# Patient Record
Sex: Female | Born: 1945 | Race: White | Hispanic: No | Smoking: Former smoker
Health system: Southern US, Community
[De-identification: ages and names within clinical notes are randomized; demographics above are authoritative.]

## PROBLEM LIST (undated history)

## (undated) DIAGNOSIS — R05 Cough: Secondary | ICD-10-CM

## (undated) DIAGNOSIS — Z9109 Other allergy status, other than to drugs and biological substances: Secondary | ICD-10-CM

## (undated) DIAGNOSIS — M7120 Synovial cyst of popliteal space [Baker], unspecified knee: Secondary | ICD-10-CM

## (undated) DIAGNOSIS — R053 Chronic cough: Secondary | ICD-10-CM

## (undated) DIAGNOSIS — M810 Age-related osteoporosis without current pathological fracture: Secondary | ICD-10-CM

## (undated) DIAGNOSIS — E7800 Pure hypercholesterolemia, unspecified: Secondary | ICD-10-CM

## (undated) DIAGNOSIS — M1990 Unspecified osteoarthritis, unspecified site: Secondary | ICD-10-CM

## (undated) DIAGNOSIS — K219 Gastro-esophageal reflux disease without esophagitis: Secondary | ICD-10-CM

## (undated) HISTORY — PX: APPENDECTOMY: SHX54

## (undated) HISTORY — DX: Chronic cough: R05.3

## (undated) HISTORY — PX: OOPHORECTOMY: SHX86

## (undated) HISTORY — DX: Gastro-esophageal reflux disease without esophagitis: K21.9

## (undated) HISTORY — DX: Cough: R05

## (undated) HISTORY — DX: Age-related osteoporosis without current pathological fracture: M81.0

## (undated) HISTORY — DX: Synovial cyst of popliteal space (Baker), unspecified knee: M71.20

## (undated) HISTORY — DX: Other allergy status, other than to drugs and biological substances: Z91.09

## (undated) HISTORY — DX: Unspecified osteoarthritis, unspecified site: M19.90

---

## 1997-12-20 ENCOUNTER — Ambulatory Visit (HOSPITAL_COMMUNITY): Admission: RE | Admit: 1997-12-20 | Discharge: 1997-12-20 | Payer: Self-pay | Admitting: Family Medicine

## 1997-12-28 ENCOUNTER — Other Ambulatory Visit: Admission: RE | Admit: 1997-12-28 | Discharge: 1997-12-28 | Payer: Self-pay | Admitting: Family Medicine

## 1999-01-15 ENCOUNTER — Other Ambulatory Visit: Admission: RE | Admit: 1999-01-15 | Discharge: 1999-01-15 | Payer: Self-pay | Admitting: Family Medicine

## 1999-03-21 ENCOUNTER — Encounter: Admission: RE | Admit: 1999-03-21 | Discharge: 1999-03-21 | Payer: Self-pay | Admitting: Family Medicine

## 2000-01-14 ENCOUNTER — Other Ambulatory Visit: Admission: RE | Admit: 2000-01-14 | Discharge: 2000-01-14 | Payer: Self-pay | Admitting: Family Medicine

## 2000-03-25 ENCOUNTER — Encounter: Admission: RE | Admit: 2000-03-25 | Discharge: 2000-03-25 | Payer: Self-pay | Admitting: Family Medicine

## 2000-03-25 ENCOUNTER — Encounter: Payer: Self-pay | Admitting: Family Medicine

## 2000-05-05 ENCOUNTER — Encounter: Admission: RE | Admit: 2000-05-05 | Discharge: 2000-05-05 | Payer: Self-pay | Admitting: Family Medicine

## 2000-05-05 ENCOUNTER — Encounter: Payer: Self-pay | Admitting: Family Medicine

## 2001-01-14 ENCOUNTER — Other Ambulatory Visit: Admission: RE | Admit: 2001-01-14 | Discharge: 2001-01-14 | Payer: Self-pay | Admitting: Family Medicine

## 2001-03-26 ENCOUNTER — Encounter: Payer: Self-pay | Admitting: Family Medicine

## 2001-03-26 ENCOUNTER — Encounter: Admission: RE | Admit: 2001-03-26 | Discharge: 2001-03-26 | Payer: Self-pay | Admitting: Family Medicine

## 2002-01-15 ENCOUNTER — Other Ambulatory Visit: Admission: RE | Admit: 2002-01-15 | Discharge: 2002-01-15 | Payer: Self-pay | Admitting: Family Medicine

## 2002-03-30 ENCOUNTER — Encounter: Payer: Self-pay | Admitting: Family Medicine

## 2002-03-30 ENCOUNTER — Encounter: Admission: RE | Admit: 2002-03-30 | Discharge: 2002-03-30 | Payer: Self-pay | Admitting: Family Medicine

## 2003-01-19 ENCOUNTER — Other Ambulatory Visit: Admission: RE | Admit: 2003-01-19 | Discharge: 2003-01-19 | Payer: Self-pay | Admitting: Family Medicine

## 2003-04-07 ENCOUNTER — Encounter: Admission: RE | Admit: 2003-04-07 | Discharge: 2003-04-07 | Payer: Self-pay | Admitting: Family Medicine

## 2004-01-31 ENCOUNTER — Other Ambulatory Visit: Admission: RE | Admit: 2004-01-31 | Discharge: 2004-01-31 | Payer: Self-pay | Admitting: Family Medicine

## 2004-04-16 ENCOUNTER — Encounter: Admission: RE | Admit: 2004-04-16 | Discharge: 2004-04-16 | Payer: Self-pay | Admitting: Family Medicine

## 2005-02-01 ENCOUNTER — Other Ambulatory Visit: Admission: RE | Admit: 2005-02-01 | Discharge: 2005-02-01 | Payer: Self-pay | Admitting: Family Medicine

## 2005-02-15 ENCOUNTER — Ambulatory Visit: Payer: Self-pay | Admitting: Internal Medicine

## 2005-02-27 ENCOUNTER — Ambulatory Visit: Payer: Self-pay | Admitting: Internal Medicine

## 2005-04-18 ENCOUNTER — Encounter: Admission: RE | Admit: 2005-04-18 | Discharge: 2005-04-18 | Payer: Self-pay | Admitting: Family Medicine

## 2006-04-04 ENCOUNTER — Other Ambulatory Visit: Admission: RE | Admit: 2006-04-04 | Discharge: 2006-04-04 | Payer: Self-pay | Admitting: Family Medicine

## 2006-04-23 ENCOUNTER — Encounter: Admission: RE | Admit: 2006-04-23 | Discharge: 2006-04-23 | Payer: Self-pay | Admitting: Family Medicine

## 2006-11-12 ENCOUNTER — Encounter: Admission: RE | Admit: 2006-11-12 | Discharge: 2006-11-12 | Payer: Self-pay | Admitting: Family Medicine

## 2006-11-15 ENCOUNTER — Encounter: Admission: RE | Admit: 2006-11-15 | Discharge: 2006-11-15 | Payer: Self-pay | Admitting: Unknown Physician Specialty

## 2007-04-08 ENCOUNTER — Other Ambulatory Visit: Admission: RE | Admit: 2007-04-08 | Discharge: 2007-04-08 | Payer: Self-pay | Admitting: Family Medicine

## 2007-04-27 ENCOUNTER — Encounter: Admission: RE | Admit: 2007-04-27 | Discharge: 2007-04-27 | Payer: Self-pay | Admitting: Family Medicine

## 2007-09-29 ENCOUNTER — Encounter: Admission: RE | Admit: 2007-09-29 | Discharge: 2007-09-29 | Payer: Self-pay | Admitting: Family Medicine

## 2007-10-05 ENCOUNTER — Encounter: Admission: RE | Admit: 2007-10-05 | Discharge: 2007-10-05 | Payer: Self-pay | Admitting: Family Medicine

## 2007-11-17 ENCOUNTER — Encounter: Admission: RE | Admit: 2007-11-17 | Discharge: 2007-11-17 | Payer: Self-pay | Admitting: Family Medicine

## 2008-02-16 ENCOUNTER — Encounter: Admission: RE | Admit: 2008-02-16 | Discharge: 2008-02-16 | Payer: Self-pay | Admitting: Dermatology

## 2008-04-13 ENCOUNTER — Other Ambulatory Visit: Admission: RE | Admit: 2008-04-13 | Discharge: 2008-04-13 | Payer: Self-pay | Admitting: Family Medicine

## 2008-04-28 ENCOUNTER — Encounter: Admission: RE | Admit: 2008-04-28 | Discharge: 2008-04-28 | Payer: Self-pay | Admitting: Family Medicine

## 2008-09-24 ENCOUNTER — Emergency Department (HOSPITAL_COMMUNITY): Admission: EM | Admit: 2008-09-24 | Discharge: 2008-09-24 | Payer: Self-pay | Admitting: Emergency Medicine

## 2009-05-01 ENCOUNTER — Encounter: Admission: RE | Admit: 2009-05-01 | Discharge: 2009-05-01 | Payer: Self-pay | Admitting: Family Medicine

## 2010-05-07 ENCOUNTER — Encounter: Admission: RE | Admit: 2010-05-07 | Discharge: 2010-05-07 | Payer: Self-pay | Admitting: Family Medicine

## 2011-04-08 ENCOUNTER — Other Ambulatory Visit: Payer: Self-pay | Admitting: Family Medicine

## 2011-04-08 DIAGNOSIS — Z1231 Encounter for screening mammogram for malignant neoplasm of breast: Secondary | ICD-10-CM

## 2011-04-25 ENCOUNTER — Other Ambulatory Visit: Payer: Self-pay | Admitting: Family Medicine

## 2011-04-25 DIAGNOSIS — Z780 Asymptomatic menopausal state: Secondary | ICD-10-CM

## 2011-05-09 ENCOUNTER — Ambulatory Visit
Admission: RE | Admit: 2011-05-09 | Discharge: 2011-05-09 | Disposition: A | Discharge status: Home / Self Care | Payer: Medicare Other | Source: Ambulatory Visit | Attending: Family Medicine | Admitting: Family Medicine

## 2011-05-09 DIAGNOSIS — Z1231 Encounter for screening mammogram for malignant neoplasm of breast: Secondary | ICD-10-CM

## 2011-05-09 DIAGNOSIS — Z780 Asymptomatic menopausal state: Secondary | ICD-10-CM

## 2012-04-02 ENCOUNTER — Other Ambulatory Visit: Payer: Self-pay | Admitting: Family Medicine

## 2012-04-02 DIAGNOSIS — Z1231 Encounter for screening mammogram for malignant neoplasm of breast: Secondary | ICD-10-CM

## 2012-05-11 ENCOUNTER — Ambulatory Visit
Admission: RE | Admit: 2012-05-11 | Discharge: 2012-05-11 | Disposition: A | Discharge status: Home / Self Care | Payer: Medicare Other | Source: Ambulatory Visit | Attending: Family Medicine | Admitting: Family Medicine

## 2012-05-11 DIAGNOSIS — Z1231 Encounter for screening mammogram for malignant neoplasm of breast: Secondary | ICD-10-CM

## 2013-04-07 ENCOUNTER — Other Ambulatory Visit: Payer: Self-pay

## 2013-04-07 DIAGNOSIS — Z1231 Encounter for screening mammogram for malignant neoplasm of breast: Secondary | ICD-10-CM

## 2013-04-26 ENCOUNTER — Other Ambulatory Visit: Payer: Self-pay | Admitting: Family Medicine

## 2013-04-26 DIAGNOSIS — M810 Age-related osteoporosis without current pathological fracture: Secondary | ICD-10-CM

## 2013-05-12 ENCOUNTER — Ambulatory Visit: Payer: Medicare Other

## 2013-05-28 ENCOUNTER — Ambulatory Visit
Admission: RE | Admit: 2013-05-28 | Discharge: 2013-05-28 | Disposition: A | Discharge status: Home / Self Care | Payer: Medicare Other | Source: Ambulatory Visit

## 2013-05-28 ENCOUNTER — Ambulatory Visit
Admission: RE | Admit: 2013-05-28 | Discharge: 2013-05-28 | Disposition: A | Discharge status: Home / Self Care | Payer: Medicare Other | Source: Ambulatory Visit | Attending: Family Medicine | Admitting: Family Medicine

## 2013-05-28 DIAGNOSIS — M810 Age-related osteoporosis without current pathological fracture: Secondary | ICD-10-CM

## 2013-05-28 DIAGNOSIS — Z1231 Encounter for screening mammogram for malignant neoplasm of breast: Secondary | ICD-10-CM

## 2014-05-02 ENCOUNTER — Other Ambulatory Visit: Payer: Self-pay

## 2014-05-02 DIAGNOSIS — Z1231 Encounter for screening mammogram for malignant neoplasm of breast: Secondary | ICD-10-CM

## 2014-05-30 ENCOUNTER — Ambulatory Visit
Admission: RE | Admit: 2014-05-30 | Discharge: 2014-05-30 | Disposition: A | Discharge status: Home / Self Care | Payer: 59 | Source: Ambulatory Visit

## 2014-05-30 DIAGNOSIS — Z1231 Encounter for screening mammogram for malignant neoplasm of breast: Secondary | ICD-10-CM

## 2014-12-08 ENCOUNTER — Encounter: Payer: Self-pay | Admitting: Internal Medicine

## 2015-02-19 ENCOUNTER — Emergency Department (HOSPITAL_COMMUNITY): Payer: Medicare Other

## 2015-02-19 ENCOUNTER — Emergency Department (HOSPITAL_COMMUNITY)
Admission: EM | Admit: 2015-02-19 | Discharge: 2015-02-20 | Disposition: A | Discharge status: Home / Self Care | Payer: Medicare Other | Attending: Emergency Medicine | Admitting: Emergency Medicine

## 2015-02-19 ENCOUNTER — Encounter (HOSPITAL_COMMUNITY): Payer: Self-pay | Admitting: Emergency Medicine

## 2015-02-19 DIAGNOSIS — S42102A Fracture of unspecified part of scapula, left shoulder, initial encounter for closed fracture: Secondary | ICD-10-CM

## 2015-02-19 DIAGNOSIS — S42112A Displaced fracture of body of scapula, left shoulder, initial encounter for closed fracture: Secondary | ICD-10-CM | POA: Diagnosis not present

## 2015-02-19 DIAGNOSIS — Z8639 Personal history of other endocrine, nutritional and metabolic disease: Secondary | ICD-10-CM | POA: Diagnosis not present

## 2015-02-19 DIAGNOSIS — S52515A Nondisplaced fracture of left radial styloid process, initial encounter for closed fracture: Secondary | ICD-10-CM | POA: Diagnosis not present

## 2015-02-19 DIAGNOSIS — S4992XA Unspecified injury of left shoulder and upper arm, initial encounter: Secondary | ICD-10-CM | POA: Diagnosis present

## 2015-02-19 DIAGNOSIS — W19XXXA Unspecified fall, initial encounter: Secondary | ICD-10-CM

## 2015-02-19 DIAGNOSIS — Z87891 Personal history of nicotine dependence: Secondary | ICD-10-CM | POA: Diagnosis not present

## 2015-02-19 DIAGNOSIS — Y9301 Activity, walking, marching and hiking: Secondary | ICD-10-CM | POA: Diagnosis not present

## 2015-02-19 DIAGNOSIS — Y998 Other external cause status: Secondary | ICD-10-CM | POA: Diagnosis not present

## 2015-02-19 DIAGNOSIS — W541XXA Struck by dog, initial encounter: Secondary | ICD-10-CM | POA: Diagnosis not present

## 2015-02-19 DIAGNOSIS — S52502A Unspecified fracture of the lower end of left radius, initial encounter for closed fracture: Secondary | ICD-10-CM

## 2015-02-19 DIAGNOSIS — Y9289 Other specified places as the place of occurrence of the external cause: Secondary | ICD-10-CM | POA: Diagnosis not present

## 2015-02-19 HISTORY — DX: Pure hypercholesterolemia, unspecified: E78.00

## 2015-02-19 MED ORDER — ONDANSETRON 4 MG PO TBDP
4.0000 mg | ORAL_TABLET | Freq: Once | ORAL | Status: AC
  Administered 2015-02-19: 4 mg via ORAL
  Filled 2015-02-19: qty 1

## 2015-02-19 MED ORDER — OXYCODONE HCL 5 MG PO TABS
2.5000 mg | ORAL_TABLET | Freq: Once | ORAL | Status: AC
  Administered 2015-02-19: 2.5 mg via ORAL
  Filled 2015-02-19: qty 1

## 2015-02-19 NOTE — ED Provider Notes (Signed)
CSN: [id number]     Arrival date & time 02/19/15  2035 History   First MD Initiated Contact with Patient 02/19/15 2213     Chief Complaint  Patient presents with  . Fall     Patient is a 69 y.o. female presenting with fall. The history is provided by the patient. No language interpreter was used.  Fall   Ms. Lesko presents for evaluation of injuries following a fall. She was walking her border collie about 7:30 this evening when the dog saw another dog and holds her left arm forward in the leash. She was pulled forward and struck her face on the fence and rolled slightly down a hill. She experienced pain in her left wrist and shoulder. No loss of consciousness but she did knock out part of a tooth. She did have some nausea that is now resolved. No numbness, weakness. She denies any medical problems. She is left-handed.   Past Medical History  Diagnosis Date  . Hypercholesterolemia    Past Surgical History  Procedure Laterality Date  . Appendectomy    . Oophorectomy     No family history on file. Social History  Substance Use Topics  . Smoking status: Former Games developer  . Smokeless tobacco: None  . Alcohol Use: No   OB History    No data available     Review of Systems  All other systems reviewed and are negative.     Allergies  Codeine  Home Medications   Prior to Admission medications   Not on File   BP 107/57 mmHg  Pulse 62  Temp(Src) 97.9 F (36.6 C) (Oral)  Resp 18  Ht  (1.676 m)  Wt 144 lb (65.318 kg)  BMI 23.25 kg/m2  SpO2 97% Physical Exam  Constitutional: She is oriented to person, place, and time. She appears well-developed and well-nourished.  HENT:  Head: Normocephalic and atraumatic.  Cardiovascular: Normal rate and regular rhythm.   No murmur heard. Pulmonary/Chest: Effort normal and breath sounds normal. No respiratory distress.  Abdominal: Soft. There is no tenderness. There is no rebound and no guarding.  Musculoskeletal:  Tenderness  to palpation over the left posterior shoulder with decreased range of motion. Minimal tenderness and swelling to the left wrist with preserved range of motion. 2+ radial pulses bilaterally. No pelvic tenderness to palpation.  Neurological: She is alert and oriented to person, place, and time.  5/5 grip strength in bilateral upper extremitiesy  Skin: Skin is warm and dry.  Psychiatric: She has a normal mood and affect. Her behavior is normal.  Nursing note and vitals reviewed.   ED Course  Procedures (including critical care time) Labs Review Labs Reviewed - No data to display  Imaging Review Dg Wrist Complete Left  02/19/2015   CLINICAL DATA:  Left wrist pain after fall and injury. Fall while walking her dog, "leash got hung up".  EXAM: LEFT WRIST - COMPLETE 3+ VIEW  COMPARISON:  None.  FINDINGS: There is nondisplaced fracture of the radial styloid extending into the radiocarpal articulation. No additional acute fracture. The bones are under mineralized. The alignment is maintained. Scaphoid is intact. Mild soft tissue edema.  IMPRESSION: Nondisplaced radial styloid fracture extending into the radiocarpal joint.   Electronically Signed   By: Rubye Oaks M.D.   On: 02/19/2015 22:13   Dg Shoulder Left  02/19/2015   CLINICAL DATA:  Left [id number] after fall and injury. Fall while walking her dog, "leash got hung up".  EXAM: LEFT SHOULDER - 2+ VIEW  COMPARISON:  None.  FINDINGS: Suspect scapular body fracture, incompletely characterized radiographically. Glenohumeral alignment is maintained. Mild osteoarthritis of the acromioclavicular joint. Small soft tissue calcifications adjacent to the greater tuberosity, likely sequela of rotator cuff pathology.  IMPRESSION: Suspect scapular body fracture. This could be further characterized with CT.   Electronically Signed   By: Rubye Oaks M.D.   On: 02/19/2015 22:18   I have personally reviewed and evaluated these images and lab results as part  of my medical decision-making.   EKG Interpretation None      MDM   Final diagnoses:  Fall, initial encounter  Distal radius fracture, left, closed, initial encounter    Patient here for evaluation of left wrist and shoulder pain following a mechanical fall. She is neurovascularly intact on examination. She has a distal radius fracture, plan to place in a splint with follow up with Dr. Merlyn Lot.  Plain films demonstrate scapula fracture, CT scan pending to better evaluate at time of dictation.  Patient care transferred to Dr. Elesa Massed pending CT scan.      Tilden Fossa, MD 02/20/15 0200

## 2015-02-19 NOTE — ED Notes (Signed)
Pt. fell while walking her dog ( dog pulled forward) at the yard this evening , denies LOC , presents with chipped left upper incisor , left shoulder pain and left wrist pain . Alert and oriented / respirations unlabored.

## 2015-02-20 MED ORDER — OXYCODONE-ACETAMINOPHEN 5-325 MG PO TABS: 0.5000 | ORAL_TABLET | Freq: Four times a day (QID) | ORAL | Status: DC | PRN

## 2015-02-20 MED ORDER — DOCUSATE SODIUM 100 MG PO CAPS: 100.0000 mg | ORAL_CAPSULE | Freq: Two times a day (BID) | ORAL | Status: DC

## 2015-02-20 MED ORDER — ONDANSETRON 4 MG PO TBDP: 4.0000 mg | ORAL_TABLET | Freq: Three times a day (TID) | ORAL | Status: AC | PRN

## 2015-02-20 NOTE — ED Notes (Signed)
Ortho contacted  °

## 2015-02-20 NOTE — ED Notes (Signed)
Spoke to Dr. Madilyn Hook in regards to plan of care, awaiting CT results.

## 2015-02-20 NOTE — Progress Notes (Signed)
Orthopedic Tech Progress Note Patient Details:  Ann Brady 1946/01/23 [id number]  Ortho Devices Type of Ortho Device: Ace wrap, Arm sling, Sugartong splint Ortho Device/Splint Location: LUE Ortho Device/Splint Interventions: Application   Asia R Thompson 02/20/2015, 3:26 AM

## 2015-02-20 NOTE — ED Provider Notes (Addendum)
2:00 AM  Assumed care from Dr. Madilyn Hook.  Pt is a 69 y.o. female with history of hyperlipidemia, osteoporosis who had a fall around 7:30 this evening when her dog pulled her to the ground. She did hit her head. She is left-hand dominant. Has a left radial styloid fracture and a possible left scapular fracture. CT of her head and neck are unremarkable. CT of her scapula is pending. She will likely need orthopedic consult.   3:00 AM  Pt's CT scan shows a comminuted scapular fracture with intra-articular extension into the glenohumeral joint. Discussed with Dr. Eulah Pont who is on-call for orthopedics. Given the patient's pain is well controlled both he and I feel the patient can be discharged with outpatient follow-up. Place her in a sling and place a splint on her wrist. He states he is happy to see the patient for both her scapular and radial styloid fracture. Will discharge patient with prescription for Percocet and Zofran. Have discussed return precautions. Patient is very pleasant and appears very comfortable in the room. She and her husband verbalize understanding and are comfortable with discharge home.    Layla Maw Ward, DO 02/20/15 0206  Layla Maw Ward, DO 02/20/15 4098

## 2015-02-20 NOTE — ED Notes (Signed)
Discharge instructions/prescriptions reviewed with patient/spouse. Understanding verbalized. Patient transported via wheelchair from ED at time of discharge. No acute distress noted.

## 2015-02-20 NOTE — Discharge Instructions (Signed)
Cast or Splint Care °Casts and splints support injured limbs and keep bones from moving while they heal. It is important to care for your cast or splint at home.   °HOME CARE INSTRUCTIONS °· Keep the cast or splint uncovered during the drying period. It can take 24 to 48 hours to dry if it is made of plaster. A fiberglass cast will dry in less than 1 hour. °· Do not rest the cast on anything harder than a pillow for the first 24 hours. °· Do not put weight on your injured limb or apply pressure to the cast until your health care provider gives you permission. °· Keep the cast or splint dry. Wet casts or splints can lose their shape and may not support the limb as well. A wet cast that has lost its shape can also create harmful pressure on your skin when it dries. Also, wet skin can become infected. °¨ Cover the cast or splint with a plastic bag when bathing or when out in the rain or snow. If the cast is on the trunk of the body, take sponge baths until the cast is removed. °¨ If your cast does become wet, dry it with a towel or a blow dryer on the cool setting only. °· Keep your cast or splint clean. Soiled casts may be wiped with a moistened cloth. °· Do not place any hard or soft foreign objects under your cast or splint, such as cotton, toilet paper, lotion, or powder. °· Do not try to scratch the skin under the cast with any object. The object could get stuck inside the cast. Also, scratching could lead to an infection. If itching is a problem, use a blow dryer on a cool setting to relieve discomfort. °· Do not trim or cut your cast or remove padding from inside of it. °· Exercise all joints next to the injury that are not immobilized by the cast or splint. For example, if you have a long leg cast, exercise the hip joint and toes. If you have an arm cast or splint, exercise the shoulder, elbow, thumb, and fingers. °· Elevate your injured arm or leg on 1 or 2 pillows for the first 1 to 3 days to decrease  swelling and pain. It is best if you can comfortably elevate your cast so it is higher than your heart. °SEEK MEDICAL CARE IF:  °· Your cast or splint cracks. °· Your cast or splint is too tight or too loose. °· You have unbearable itching inside the cast. °· Your cast becomes wet or develops a soft spot or area. °· You have a bad smell coming from inside your cast. °· You get an object stuck under your cast. °· Your skin around the cast becomes red or raw. °· You have new pain or worsening pain after the cast has been applied. °SEEK IMMEDIATE MEDICAL CARE IF:  °· You have fluid leaking through the cast. °· You are unable to move your fingers or toes. °· You have discolored (blue or white), cool, painful, or very swollen fingers or toes beyond the cast. °· You have tingling or numbness around the injured area. °· You have severe pain or pressure under the cast. °· You have any difficulty with your breathing or have shortness of breath. °· You have chest pain. °Document Released: 05/24/2000 Document Revised: 03/17/2013 Document Reviewed: 12/03/2012 °Exit[employer]® Patient Information ©2015 [employer]. This information is not intended to replace advice given to you by your health care   provider. Make sure you discuss any questions you have with your health care provider.  Scapular Fracture You have a fracture (break in bone) of your scapula. This is your shoulder blade. It is the large flat bone behind your shoulder. This is also the bone that makes up the ball and socket joint of your shoulder. Most of the time surgery is not required for injuries to this bone unless the socket of the shoulder joint is involved. DIAGNOSIS  Because of the severity of force usually required to break this bone, x-rays are often taken of other bones likely to be injured at the same time. X-rays of the hip, knee, and pelvis may be taken. Specialized x-rays (arteriograms) may be needed if there are injuries to large blood vessels  associated with this injury. HOME CARE INSTRUCTIONS   Simple fractures of the scapula can be treated with a sling and swathe type of immobilization. This means the involved area is held in place by putting the arm in a sling. A wrap is made around the upper arm with the sling holding the arm next to the chest. This may be removed for bathing as instructed by your caregiver.  Apply ice to the injury for 15-20 minutes 03-04 times per day. Put the ice in a plastic bag. Place a towel between the bag of ice and your skin, splint, or immobilization device.  Do not resume use until instructed by your caregiver. Usually full rehabilitation (exercises to improve the injury site) will begin sometime after the sling and swathe are removed. Then begin use gradually as directed. Do not increase use to the point of pain. If pain develops, decrease use and continue the above measures. Slowly increase activities that do not cause discomfort until you gradually achieve normal use without pain.  Only take over-the-counter or prescription medicines for pain, discomfort, or fever as directed by your caregiver. SEEK IMMEDIATE MEDICAL CARE IF:   Your pain and swelling increase and is uncontrolled with medications.  You develop new, unexplained symptoms or an increase of the symptoms which brought you to your caregiver.  You develop shortness or breath or cough up blood.  You are unable to move your arm or fingers. You develop warmth and swelling in your affected arm.  You develop an unexplained temperature. Document Released: 05/27/2005 Document Revised: 08/19/2011 Document Reviewed: 04/18/2006 [facility] Patient Information 2015 [place], [place]. This information is not intended to replace advice given to you by your health care provider. Make sure you discuss any questions you have with your health care provider.  Radial Fracture You have a broken bone (fracture) of the forearm. This is the part of your arm  between the elbow and your wrist. Your forearm is made up of two bones. These are the radius and ulna. Your fracture is in the radial shaft. This is the bone in your forearm located on the thumb side. A cast or splint is used to protect and keep your injured bone from moving. The cast or splint will be on generally for about 5 to 6 weeks, with individual variations. HOME CARE INSTRUCTIONS   Keep the injured part elevated while sitting or lying down. Keep the injury above the level of your heart (the center of the chest). This will decrease swelling and pain.  Apply ice to the injury for 15-20 minutes, 03-04 times per day while awake, for 2 days. Put the ice in a plastic bag and place a towel between the bag of ice and  your cast or splint.  Move your fingers to avoid stiffness and minimize swelling.  If you have a plaster or fiberglass cast:  Do not try to scratch the skin under the cast using sharp or pointed objects.  Check the skin around the cast every day. You may put lotion on any red or sore areas.  Keep your cast dry and clean.  If you have a plaster splint:  Wear the splint as directed.  You may loosen the elastic around the splint if your fingers become numb, tingle, or turn cold or blue.  Do not put pressure on any part of your cast or splint. It may break. Rest your cast only on a pillow for the first 24 hours until it is fully hardened.  Your cast or splint can be protected during bathing with a plastic bag. Do not lower the cast or splint into water.  Only take over-the-counter or prescription medicines for pain, discomfort, or fever as directed by your caregiver. SEEK IMMEDIATE MEDICAL CARE IF:   Your cast gets damaged or breaks.  You have more severe pain or swelling than you did before getting the cast.  You have severe pain when stretching your fingers.  There is a bad smell, new stains and/or pus-like (purulent) drainage coming from under the cast.  Your  fingers or hand turn pale or blue and become cold or your loose feeling. Document Released: 11/07/2005 Document Revised: 08/19/2011 Document Reviewed: 02/03/2006 [facility] Patient Information 2015 [place], [place]. This information is not intended to replace advice given to you by your health care provider. Make sure you discuss any questions you have with your health care provider.   RICE: Routine Care for Injuries The routine care of many injuries includes Rest, Ice, Compression, and Elevation (RICE). HOME CARE INSTRUCTIONS  Rest is needed to allow your body to heal. Routine activities can usually be resumed when comfortable. Injured tendons and bones can take up to 6 weeks to heal. Tendons are the cord-like structures that attach muscle to bone.  Ice following an injury helps keep the swelling down and reduces pain.  Put ice in a plastic bag.  Place a towel between your skin and the bag.  Leave the ice on for 15-20 minutes, 3-4 times a day, or as directed by your health care provider. Do this while awake, for the first 24 to 48 hours. After that, continue as directed by your caregiver.  Compression helps keep swelling down. It also gives support and helps with discomfort. If an elastic bandage has been applied, it should be removed and reapplied every 3 to 4 hours. It should not be applied tightly, but firmly enough to keep swelling down. Watch fingers or toes for swelling, bluish discoloration, coldness, numbness, or excessive pain. If any of these problems occur, remove the bandage and reapply loosely. Contact your caregiver if these problems continue.  Elevation helps reduce swelling and decreases pain. With extremities, such as the arms, hands, legs, and feet, the injured area should be placed near or above the level of the heart, if possible. SEEK IMMEDIATE MEDICAL CARE IF:  You have persistent pain and swelling.  You develop redness, numbness, or unexpected weakness.  Your symptoms  are getting worse rather than improving after several days. These symptoms may indicate that further evaluation or further X-rays are needed. Sometimes, X-rays may not show a small broken bone (fracture) until 1 week or 10 days later. Make a follow-up appointment with your caregiver. Ask when  your X-ray results will be ready. Make sure you get your X-ray results. Document Released: 09/08/2000 Document Revised: 06/01/2013 Document Reviewed: 10/26/2010 [facility] Patient Information 2015 [place], [place]. This information is not intended to replace advice given to you by your health care provider. Make sure you discuss any questions you have with your health care provider.

## 2015-04-27 ENCOUNTER — Other Ambulatory Visit: Payer: Self-pay

## 2015-04-27 DIAGNOSIS — Z1231 Encounter for screening mammogram for malignant neoplasm of breast: Secondary | ICD-10-CM

## 2015-06-02 ENCOUNTER — Ambulatory Visit
Admission: RE | Admit: 2015-06-02 | Discharge: 2015-06-02 | Disposition: A | Discharge status: Home / Self Care | Payer: Medicare Other | Source: Ambulatory Visit

## 2015-06-02 DIAGNOSIS — Z1231 Encounter for screening mammogram for malignant neoplasm of breast: Secondary | ICD-10-CM

## 2015-06-13 ENCOUNTER — Encounter: Payer: Self-pay | Admitting: Gastroenterology

## 2015-07-26 ENCOUNTER — Ambulatory Visit (AMBULATORY_SURGERY_CENTER): Payer: Self-pay

## 2015-07-26 VITALS — Ht 66.0 in | Wt 137.6 lb

## 2015-07-26 DIAGNOSIS — Z1211 Encounter for screening for malignant neoplasm of colon: Secondary | ICD-10-CM

## 2015-07-26 MED ORDER — SUPREP BOWEL PREP KIT 17.5-3.13-1.6 GM/177ML PO SOLN: 1.0000 | Freq: Once | ORAL | Status: DC

## 2015-07-26 NOTE — Progress Notes (Signed)
No allergies to eggs or soy No past problems with anesthesia No home oxygen No diet/weight loss meds  Has email and internet; registered for emmi 

## 2015-08-08 ENCOUNTER — Ambulatory Visit (AMBULATORY_SURGERY_CENTER): Payer: Medicare Other | Admitting: Gastroenterology

## 2015-08-08 ENCOUNTER — Encounter: Payer: Self-pay | Admitting: Gastroenterology

## 2015-08-08 VITALS — BP 112/68 | HR 76 | Temp 98.6°F | Resp 22 | Ht 66.0 in | Wt 137.0 lb

## 2015-08-08 DIAGNOSIS — Z1211 Encounter for screening for malignant neoplasm of colon: Secondary | ICD-10-CM

## 2015-08-08 MED ORDER — SODIUM CHLORIDE 0.9 % IV SOLN: 500.0000 mL | INTRAVENOUS | Status: DC

## 2015-08-08 NOTE — Patient Instructions (Signed)
YOU HAD AN ENDOSCOPIC PROCEDURE TODAY AT THE [facility]:   Refer to the procedure report that was given to you for any specific questions about what was found during the examination.  If the procedure report does not answer your questions, please call your gastroenterologist to clarify.  If you requested that your care partner not be given the details of your procedure findings, then the procedure report has been included in a sealed envelope for you to review at your convenience later.  YOU SHOULD EXPECT: Some feelings of bloating in the abdomen. Passage of more gas than usual.  Walking can help get rid of the air that was put into your GI tract during the procedure and reduce the bloating. If you had a lower endoscopy (such as a colonoscopy or flexible sigmoidoscopy) you may notice spotting of blood in your stool or on the toilet paper. If you underwent a bowel prep for your procedure, you may not have a normal bowel movement for a few days.  Please Note:  You might notice some irritation and congestion in your nose or some drainage.  This is from the oxygen used during your procedure.  There is no need for concern and it should clear up in a day or so.  SYMPTOMS TO REPORT IMMEDIATELY:   Following lower endoscopy (colonoscopy or flexible sigmoidoscopy):  Excessive amounts of blood in the stool  Significant tenderness or worsening of abdominal pains  Swelling of the abdomen that is new, acute  Fever of 100F or higher  Black, tarry-looking stools  For urgent or emergent issues, a gastroenterologist can be reached at any hour by calling [phone].   DIET: Your first meal following the procedure should be a small meal and then it is ok to progress to your normal diet. Heavy or fried foods are harder to digest and may make you feel nauseous or bloated.  Likewise, meals heavy in dairy and vegetables can increase bloating.  Drink plenty of fluids but you should avoid alcoholic  beverages for 24 hours.  ACTIVITY:  You should plan to take it easy for the rest of today and you should NOT DRIVE or use heavy machinery until tomorrow (because of the sedation medicines used during the test).    FOLLOW UP: Our staff will call the number listed on your records the next business day following your procedure to check on you and address any questions or concerns that you may have regarding the information given to you following your procedure. If we do not reach you, we will leave a message.  However, if you are feeling well and you are not experiencing any problems, there is no need to return our call.  We will assume that you have returned to your regular daily activities without incident.  If any biopsies were taken you will be contacted by phone or by letter within the next 1-3 weeks.  Please call us at [phone] if you have not heard about the biopsies in 3 weeks.    SIGNATURES/CONFIDENTIALITY: You and/or your care partner have signed paperwork which will be entered into your electronic medical record.  These signatures attest to the fact that that the information above on your After Visit Summary has been reviewed and is understood.  Full responsibility of the confidentiality of this discharge information lies with you and/or your care-partner.  Diverticulosis/ Hemorrhoids handout given No more Colonoscopy screenings Resume regular diet and medications

## 2015-08-08 NOTE — Progress Notes (Signed)
A/ox3 pleased with MAC, report to Jill RN 

## 2015-08-08 NOTE — Op Note (Signed)
[facility] [address] [place], [address]   COLONOSCOPY PROCEDURE REPORT  PATIENT: Ann Brady, Ann Brady  MR#: [id number] BIRTHDATE: 1946/04/22 , 69  yrs. old GENDER: female ENDOSCOPIST: Marsa Aris, MD REFERRED ON:GEXBMWU Mardelle Matte, M.D. PROCEDURE DATE:  08/08/2015 PROCEDURE:   Colonoscopy, screening First Screening Colonoscopy - Avg.  risk and is 50 yrs.  old or older - No.  Prior Negative Screening - Now for repeat screening. 10 or more years since last screening  History of Adenoma - Now for follow-up colonoscopy & has been > or = to 3 yrs.  N/A  Polyps removed today? No Recommend repeat exam, <10 yrs? No ASA CLASS:   Class II INDICATIONS:Screening for colonic neoplasia and Colorectal Neoplasm Risk Assessment for this procedure is average risk. MEDICATIONS: Propofol 200 mg IV  DESCRIPTION OF PROCEDURE:   After the risks benefits and alternatives of the procedure were thoroughly explained, informed consent was obtained.  The digital rectal exam revealed no abnormalities of the rectum.   The LB [id number]  endoscope was introduced through the anus and advanced to the terminal ileum which was intubated for a short distance. No adverse events experienced.   The quality of the prep was good.  The instrument was then slowly withdrawn as the colon was fully examined. Estimated blood loss is zero unless otherwise noted in this procedure report.   COLON FINDINGS: There was mild diverticulosis noted in the sigmoid colon.   Small internal hemorrhoids were found.   The examination was otherwise normal.   The examined terminal ileum appeared to be normal.  Retroflexed views revealed internal hemorrhoids. The time to cecum = 5.7 Withdrawal time = 13.4   The scope was withdrawn and the procedure completed. COMPLICATIONS: There were no immediate complications.  ENDOSCOPIC IMPRESSION: 1.   Mild diverticulosis was noted in the sigmoid colon 2.   Small internal  hemorrhoids 3.   The examination was otherwise normal   RECOMMENDATIONS: Given your age, you will not need another colonoscopy for colon cancer screening or polyp surveillance.  These types of tests usually stop around the age 87.  eSigned:  Marsa Aris, MD 08/08/2015 9:48 AM

## 2015-08-09 ENCOUNTER — Telehealth: Payer: Self-pay

## 2015-08-09 NOTE — Telephone Encounter (Signed)
  Follow up Call-  Call back number 08/08/2015  Post procedure Call Back phone  # [phone]  Permission to leave phone message Yes     Patient questions:  Do you have a fever, pain , or abdominal swelling? No. Pain Score  0 *  Have you tolerated food without any problems? Yes.    Have you been able to return to your normal activities? Yes.    Do you have any questions about your discharge instructions: Diet   No. Medications  No. Follow up visit  No.  Do you have questions or concerns about your Care? No.  Actions: * If pain score is 4 or above: No action needed, pain <4.

## 2016-04-30 ENCOUNTER — Other Ambulatory Visit: Payer: Self-pay | Admitting: Family Medicine

## 2016-04-30 DIAGNOSIS — Z1231 Encounter for screening mammogram for malignant neoplasm of breast: Secondary | ICD-10-CM

## 2016-06-05 ENCOUNTER — Ambulatory Visit
Admission: RE | Admit: 2016-06-05 | Discharge: 2016-06-05 | Disposition: A | Discharge status: Home / Self Care | Payer: Medicare Other | Source: Ambulatory Visit | Attending: Family Medicine | Admitting: Family Medicine

## 2016-06-05 DIAGNOSIS — Z1231 Encounter for screening mammogram for malignant neoplasm of breast: Secondary | ICD-10-CM

## 2016-08-06 IMAGING — CR DG WRIST COMPLETE 3+V*L*
4 series · 4 of 4 positions shown · non-contrast
Comparison: None.

CLINICAL DATA: Left wrist pain after fall and injury. Fall while
walking her dog, "leash got hung up".

EXAM:
LEFT WRIST - COMPLETE 3+ VIEW

[wrist pa]
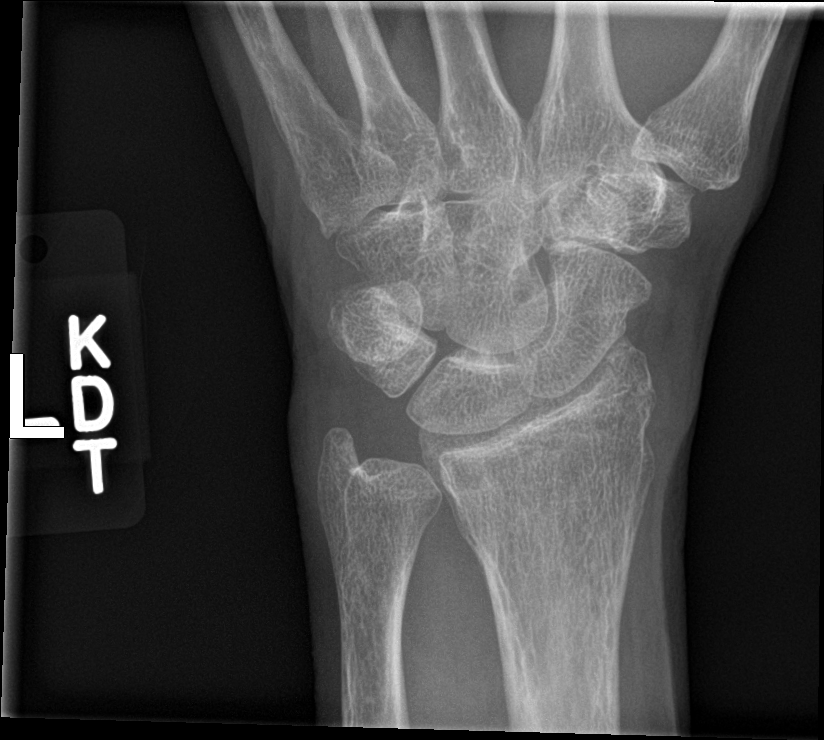

[wrist obl]
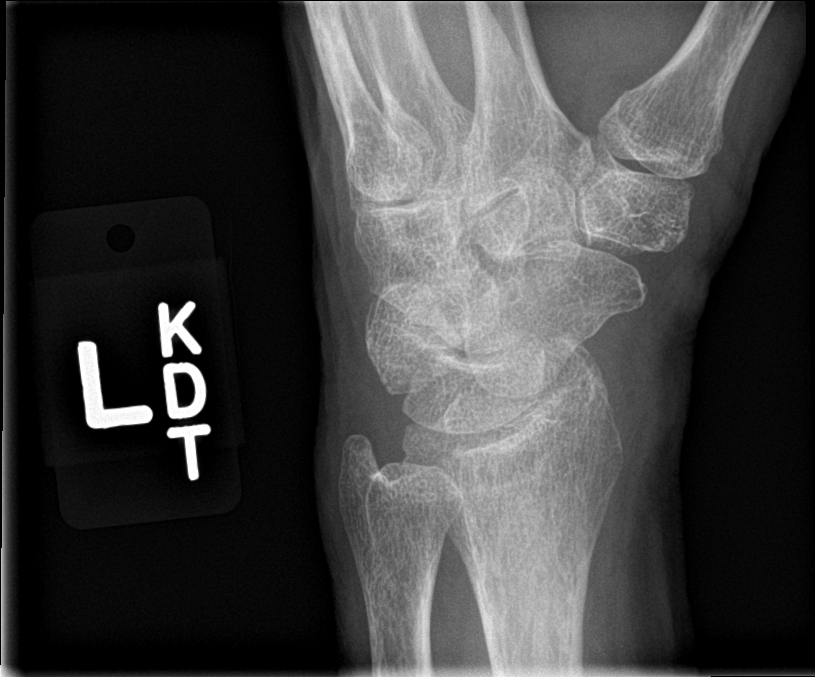

[wrist lat]
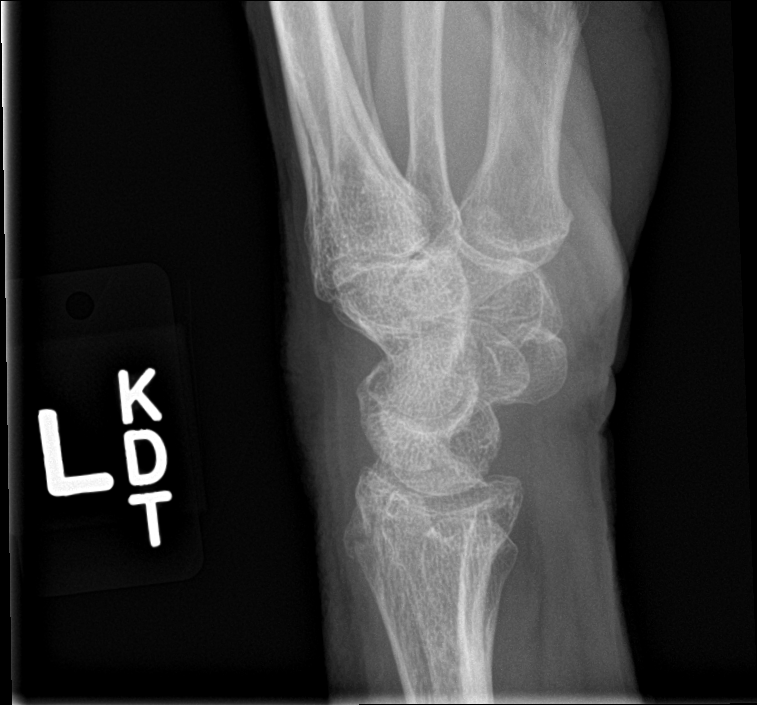

[wrist navicular]
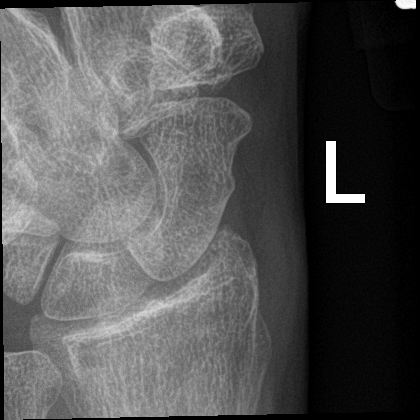

[4 of 4 positions shown; findings below may reference images not displayed]

FINDINGS: There is nondisplaced fracture of the radial styloid extending into
the radiocarpal articulation. No additional acute fracture. The
bones are under mineralized. The alignment is maintained. Scaphoid
is intact. Mild soft tissue edema.
IMPRESSION: Nondisplaced radial styloid fracture extending into the radiocarpal
joint.

## 2016-08-06 IMAGING — CT CT SHOULDER*L* W/O CM
3 series · 14 of 33 positions shown, 17 images · non-contrast
Comparison: Radiographs 1 day prior.

CLINICAL DATA: Left shoulder pain after fall. Concern for scapular
fracture on radiograph.

EXAM:
CT OF THE LEFT SHOULDER WITHOUT CONTRAST
TECHNIQUE: Multidetector CT imaging was performed according to the standard
protocol. Multiplanar CT image reconstructions were also generated.

[Series 4: shoulder left 3.0 b31s · axial · 0.52mm/px · z∈[-349,-211]mm · 6 of 61 slices shown, 8 images]
[im 10/61  soft-tissue]
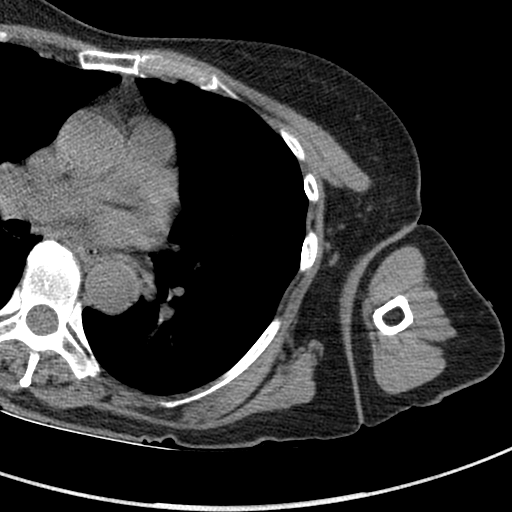
[im 10/61  bone]
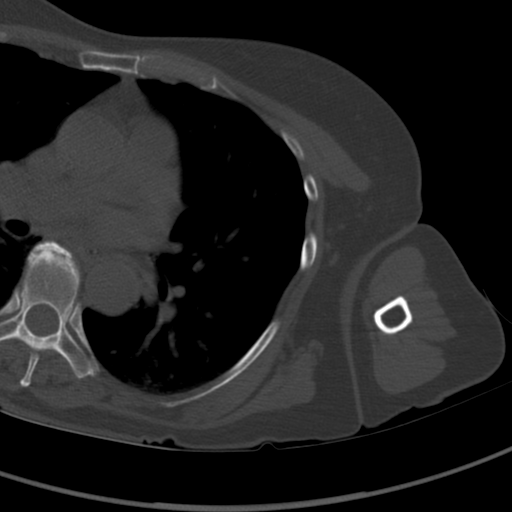
[im 19/61  bone]
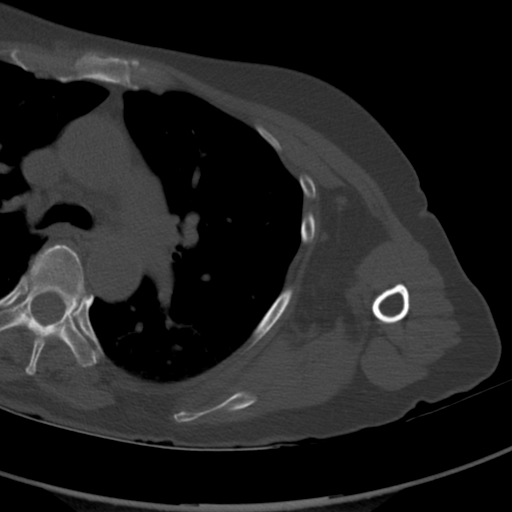
[im 28/61  bone]
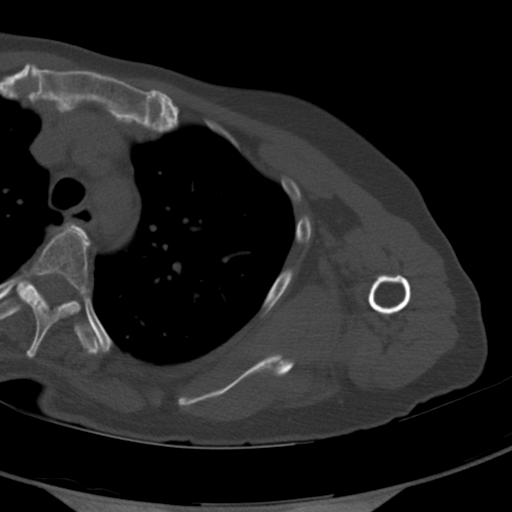
[im 37/61  bone]
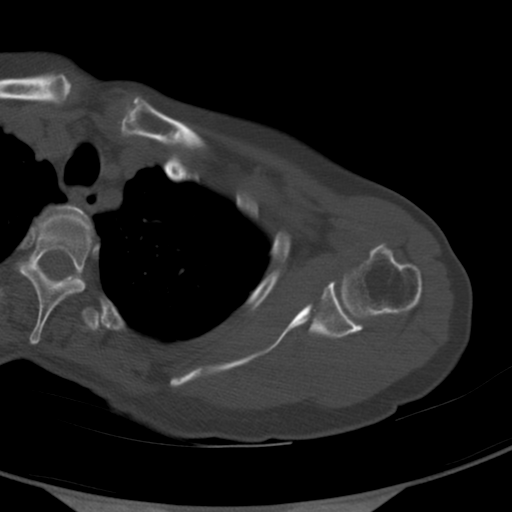
[im 47/61  soft-tissue]
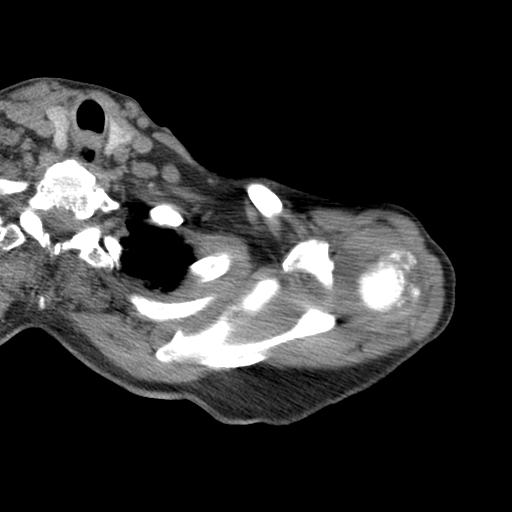
[im 47/61  bone]
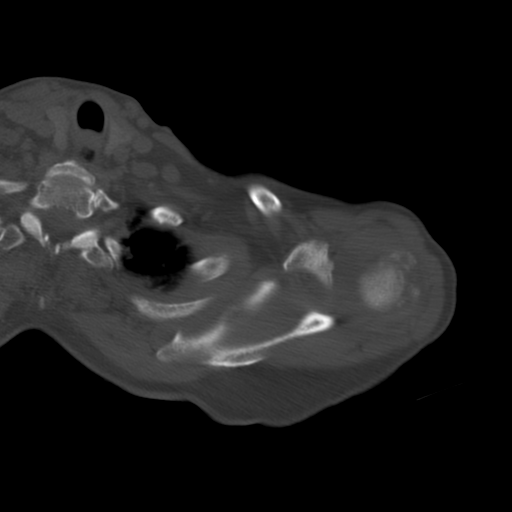
[im 56/61  bone]
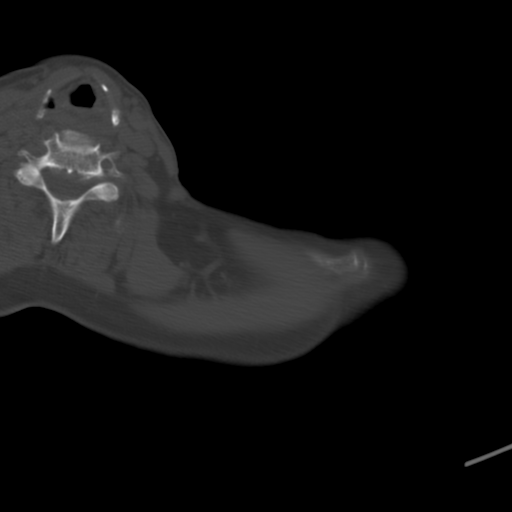

[Series 7: coronal · coronal · 0.39mm/px · 3 of 84 slices shown]
[im 17/84  bone]
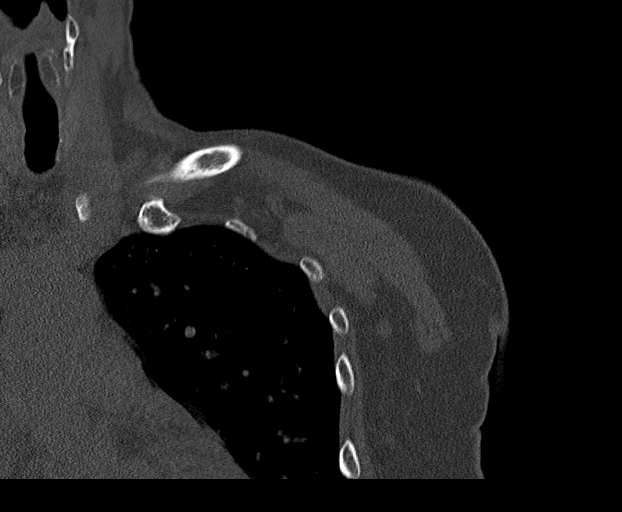
[im 34/84  bone]
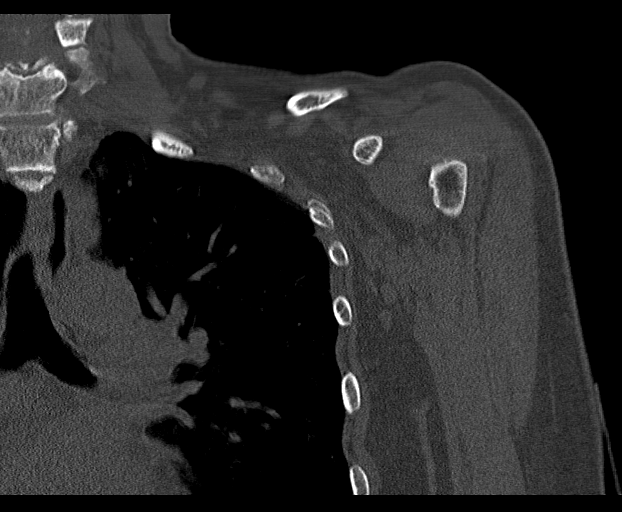
[im 50/84  bone]
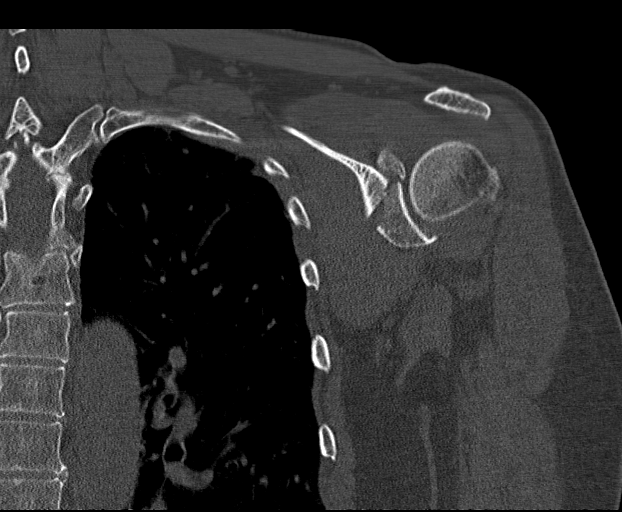

[Series 8: sagittal · sagittal · 0.31mm/px · 5 of 88 slices shown, 6 images]
[im 30/88  bone]
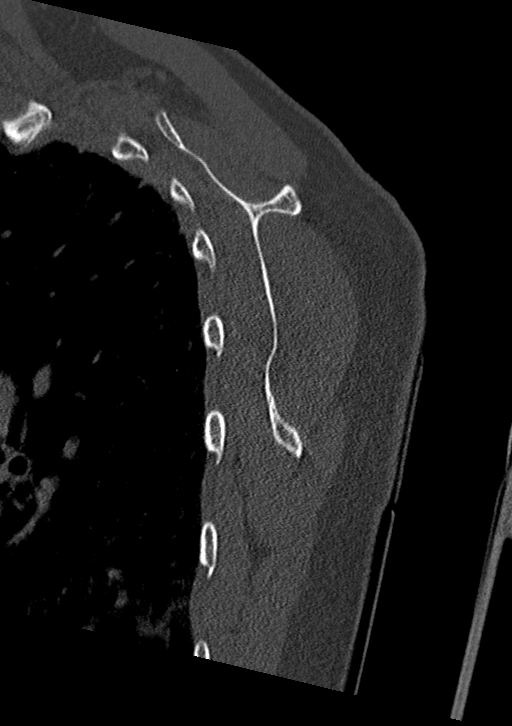
[im 37/88  bone]
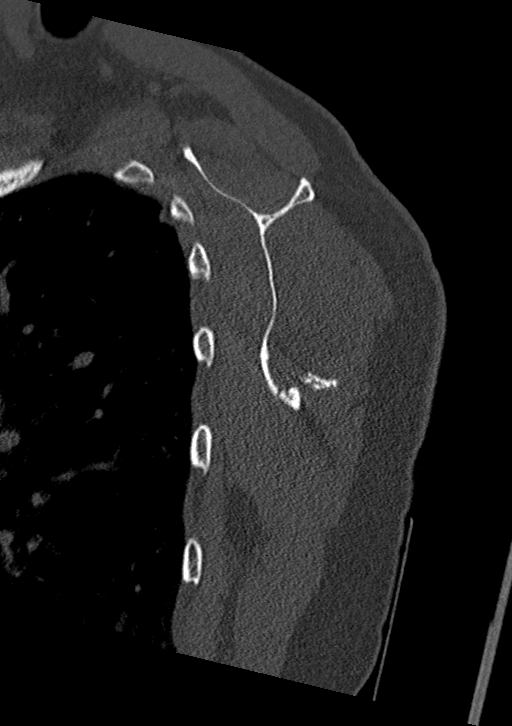
[im 44/88  soft-tissue]
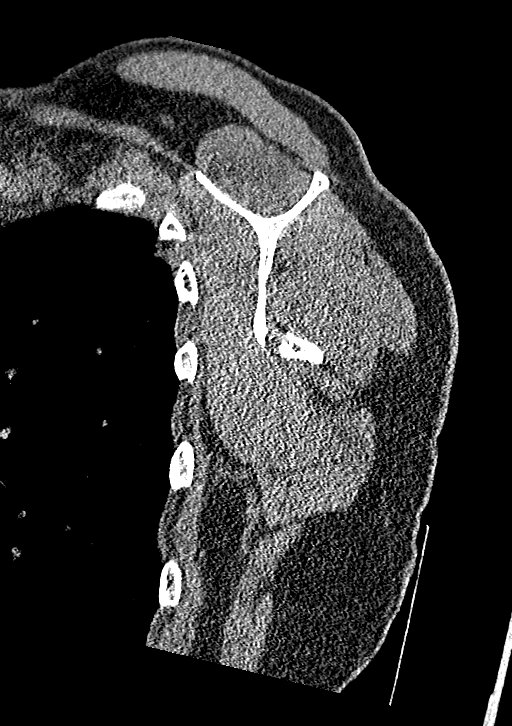
[im 44/88  bone]
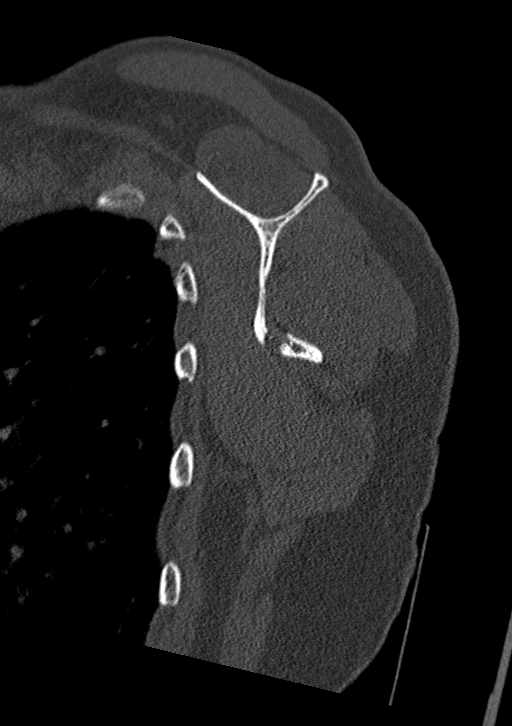
[im 51/88  bone]
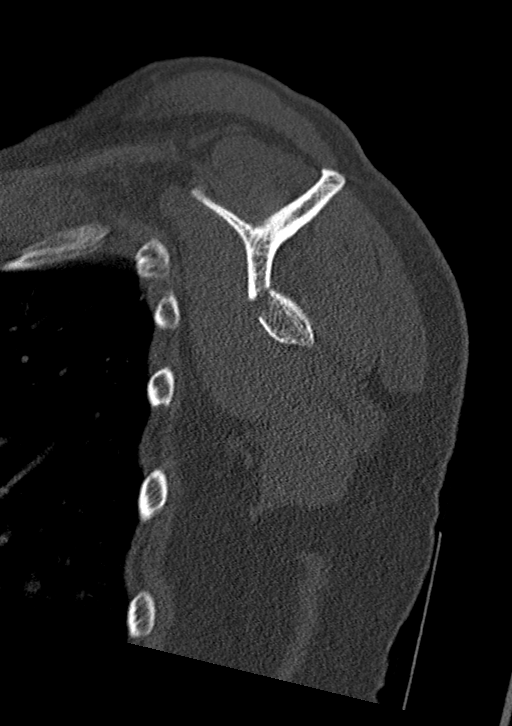
[im 59/88  bone]
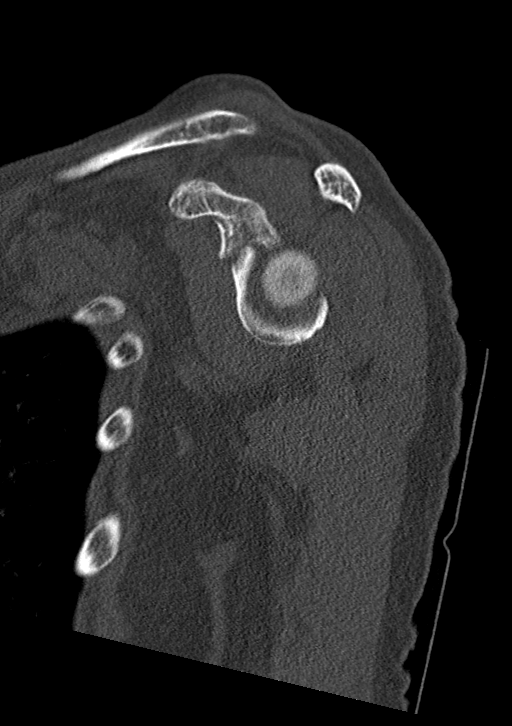

[14 of 33 positions shown; findings below may reference images not displayed]

FINDINGS: Comminuted scapular fracture. Fracture involves scapular spine
extending to the glenoid with multiple intra-articular fracture
component. Fracture extends to the base of the acromion. Fracture
extends inferiorly to involve the inferior scapular body and tip
with displacement and comminution. Proximal humerus is intact.
Acromioclavicular joint remains congruent. A amorphous soft tissue
calcifications in the region of the rotator cuff consistent with
calcific tendinopathy. No fractures of the included ribs.
IMPRESSION: Comminuted scapular fracture with intra-articular extension to the
glenohumeral joint; comminuted intra-articular extension. Displaced
and comminuted involvement extends distally to involve the distal
scapular body and tip.

## 2017-04-28 ENCOUNTER — Other Ambulatory Visit: Payer: Self-pay | Admitting: Family Medicine

## 2017-04-28 DIAGNOSIS — Z1231 Encounter for screening mammogram for malignant neoplasm of breast: Secondary | ICD-10-CM

## 2017-05-23 ENCOUNTER — Other Ambulatory Visit: Payer: Self-pay | Admitting: Family Medicine

## 2017-05-23 DIAGNOSIS — M810 Age-related osteoporosis without current pathological fracture: Secondary | ICD-10-CM

## 2017-06-11 ENCOUNTER — Ambulatory Visit
Admission: RE | Admit: 2017-06-11 | Discharge: 2017-06-11 | Disposition: A | Discharge status: Home / Self Care | Payer: Medicare Other | Source: Ambulatory Visit | Attending: Family Medicine | Admitting: Family Medicine

## 2017-06-11 DIAGNOSIS — Z1231 Encounter for screening mammogram for malignant neoplasm of breast: Secondary | ICD-10-CM

## 2017-07-09 ENCOUNTER — Inpatient Hospital Stay
Admission: RE | Admit: 2017-07-09 | Discharge: 2017-07-09 | Disposition: A | Discharge status: Home / Self Care | Payer: Medicare Other | Source: Ambulatory Visit | Attending: Family Medicine | Admitting: Family Medicine

## 2017-07-23 ENCOUNTER — Ambulatory Visit
Admission: RE | Admit: 2017-07-23 | Discharge: 2017-07-23 | Disposition: A | Discharge status: Home / Self Care | Payer: Medicare Other | Source: Ambulatory Visit | Attending: Family Medicine | Admitting: Family Medicine

## 2017-07-23 DIAGNOSIS — M810 Age-related osteoporosis without current pathological fracture: Secondary | ICD-10-CM

## 2018-05-11 ENCOUNTER — Other Ambulatory Visit: Payer: Self-pay | Admitting: Family Medicine

## 2018-05-11 DIAGNOSIS — Z1231 Encounter for screening mammogram for malignant neoplasm of breast: Secondary | ICD-10-CM

## 2018-06-19 ENCOUNTER — Ambulatory Visit
Admission: RE | Admit: 2018-06-19 | Discharge: 2018-06-19 | Disposition: A | Discharge status: Home / Self Care | Payer: Medicare Other | Source: Ambulatory Visit | Attending: Family Medicine | Admitting: Family Medicine

## 2018-06-19 DIAGNOSIS — Z1231 Encounter for screening mammogram for malignant neoplasm of breast: Secondary | ICD-10-CM

## 2019-05-19 ENCOUNTER — Other Ambulatory Visit: Payer: Self-pay | Admitting: Family Medicine

## 2019-05-19 DIAGNOSIS — Z1231 Encounter for screening mammogram for malignant neoplasm of breast: Secondary | ICD-10-CM

## 2019-07-06 ENCOUNTER — Other Ambulatory Visit: Payer: Self-pay | Admitting: Family Medicine

## 2019-07-06 DIAGNOSIS — M810 Age-related osteoporosis without current pathological fracture: Secondary | ICD-10-CM

## 2019-07-08 ENCOUNTER — Ambulatory Visit: Payer: Medicare Other

## 2019-07-18 ENCOUNTER — Ambulatory Visit: Payer: Medicare PPO | Attending: Internal Medicine

## 2019-07-18 DIAGNOSIS — Z23 Encounter for immunization: Secondary | ICD-10-CM

## 2019-07-18 NOTE — Progress Notes (Signed)
   Covid-19 Vaccination Clinic  Name:  Ann Brady    MRN: [id number] DOB: [birth year]  07/18/2019  [name] was observed post Covid-19 immunization for 15 minutes without incidence. She was provided with Vaccine Information Sheet and instruction to access the V-Safe system.   [name] was instructed to call 911 with any severe reactions post vaccine: Marland Kitchen Difficulty breathing  . Swelling of your face and throat  . A fast heartbeat  . A bad rash all over your body  . Dizziness and weakness    Immunizations Administered    Name Date Dose VIS Date Route   Pfizer COVID-19 Vaccine 07/18/2019  2:14 PM 0.3 mL 05/21/2019 Intramuscular   Manufacturer: [employer], [employer]   Lot: [id number]   NDC: [address]-3

## 2019-08-02 ENCOUNTER — Ambulatory Visit: Payer: Medicare Other

## 2019-08-04 ENCOUNTER — Ambulatory Visit
Admission: RE | Admit: 2019-08-04 | Discharge: 2019-08-04 | Disposition: A | Discharge status: Home / Self Care | Payer: Medicare PPO | Source: Ambulatory Visit | Attending: Family Medicine | Admitting: Family Medicine

## 2019-08-04 ENCOUNTER — Other Ambulatory Visit: Payer: Self-pay

## 2019-08-04 DIAGNOSIS — Z1231 Encounter for screening mammogram for malignant neoplasm of breast: Secondary | ICD-10-CM

## 2019-08-12 ENCOUNTER — Ambulatory Visit: Payer: Medicare PPO | Attending: Internal Medicine

## 2019-08-12 DIAGNOSIS — Z23 Encounter for immunization: Secondary | ICD-10-CM

## 2019-08-12 NOTE — Progress Notes (Signed)
   Covid-19 Vaccination Clinic  Name:  Ann Brady    MRN: [id number] DOB: 1945-07-07  08/12/2019  Ms. Kato was observed post Covid-19 immunization for 15 minutes without incident. She was provided with Vaccine Information Sheet and instruction to access the V-Safe system.   Ms. Stembridge was instructed to call 911 with any severe reactions post vaccine: Marland Kitchen Difficulty breathing  . Swelling of face and throat  . A fast heartbeat  . A bad rash all over body  . Dizziness and weakness   Immunizations Administered    Name Date Dose VIS Date Route   Pfizer COVID-19 Vaccine 08/12/2019  2:17 PM 0.3 mL 05/21/2019 Intramuscular   Manufacturer: [employer], [employer]   Lot: [id number]   NDC: [address]-5

## 2019-09-03 ENCOUNTER — Encounter (HOSPITAL_COMMUNITY): Payer: Self-pay

## 2019-09-03 ENCOUNTER — Ambulatory Visit (INDEPENDENT_AMBULATORY_CARE_PROVIDER_SITE_OTHER): Payer: Medicare PPO

## 2019-09-03 ENCOUNTER — Ambulatory Visit (HOSPITAL_COMMUNITY)
Admission: EM | Admit: 2019-09-03 | Discharge: 2019-09-03 | Disposition: A | Discharge status: Home / Self Care | Payer: Medicare PPO | Attending: Urgent Care | Admitting: Urgent Care

## 2019-09-03 ENCOUNTER — Other Ambulatory Visit: Payer: Self-pay

## 2019-09-03 DIAGNOSIS — M25561 Pain in right knee: Secondary | ICD-10-CM | POA: Diagnosis not present

## 2019-09-03 DIAGNOSIS — M25461 Effusion, right knee: Secondary | ICD-10-CM

## 2019-09-03 DIAGNOSIS — M1711 Unilateral primary osteoarthritis, right knee: Secondary | ICD-10-CM

## 2019-09-03 MED ORDER — PREDNISONE 20 MG PO TABS: ORAL_TABLET | ORAL | 0 refills | Status: AC

## 2019-09-03 NOTE — ED Provider Notes (Signed)
[facility]   MRN: [id number] DOB: 03-Dec-1945  Subjective:   Ann Brady is a 74 y.o. female presenting for acute onset of right knee pain today.  Patient was not doing anything in particular, just outside with her dog when the pain started.  She does have a history of a Baker's cyst, osteoporosis.  Denies fall, trauma, redness, knee buckling.  Patient is very active, kneels on said knee quite a bit when she is doing yard work and gardening.  She did take Tylenol prior to coming into the clinic.  No current facility-administered medications for this encounter.  Current Outpatient Medications:  .  alendronate (FOSAMAX) 70 MG tablet, Take 70 mg by mouth once a week. Take with a full glass of water on an empty stomach. Take on Sunday., Disp: , Rfl:  .  azelastine (ASTELIN) 0.1 % nasal spray, Place into both nostrils 2 (two) times daily as needed for rhinitis. Reported on 08/08/2015, Disp: , Rfl:  .  Bioflavonoid Products (ESTER C PO), Take 500 mg by mouth daily. Reported on 08/08/2015, Disp: , Rfl:  .  Calcium Carb-Cholecalciferol (CALCIUM 600 + D PO), Take 2 tablets by mouth daily., Disp: , Rfl:  .  cholecalciferol (VITAMIN D) 1000 UNITS tablet, Take 2,000 Units by mouth daily. , Disp: , Rfl:  .  docusate sodium (COLACE) 100 MG capsule, Take 100 mg by mouth 2 (two) times daily., Disp: , Rfl:  .  estradiol (ESTRACE) 0.1 MG/GM vaginal cream, Place 1 Applicatorful vaginally daily as needed., Disp: , Rfl:  .  fexofenadine (ALLEGRA) 180 MG tablet, Take 180 mg by mouth daily., Disp: , Rfl:  .  fluticasone (FLONASE) 50 MCG/ACT nasal spray, Place 2 sprays into both nostrils daily., Disp: , Rfl:  .  meclizine (ANTIVERT) 12.5 MG tablet, Take 12.5 mg by mouth 3 (three) times daily as needed for dizziness., Disp: , Rfl:  .  montelukast (SINGULAIR) 10 MG tablet, Take 10 mg by mouth at bedtime., Disp: , Rfl:  .  Multiple Vitamin (MULTIVITAMIN WITH MINERALS) TABS tablet, Take 1 tablet by mouth  daily. Centrum silver, Disp: , Rfl:  .  naproxen sodium (ANAPROX) 220 MG tablet, Take 220 mg by mouth daily as needed (for pain)., Disp: , Rfl:  .  omeprazole (PRILOSEC) 20 MG capsule, Take 20 mg by mouth every other day., Disp: , Rfl:  .  ondansetron (ZOFRAN ODT) 4 MG disintegrating tablet, Take 1 tablet (4 mg total) by mouth every 8 (eight) hours as needed for nausea or vomiting., Disp: 20 tablet, Rfl: 0 .  rosuvastatin (CRESTOR) 10 MG tablet, Take 10 mg by mouth at bedtime., Disp: , Rfl:    Allergies  Allergen Reactions  . Codeine Nausea And Vomiting  . Other Nausea Only    narcotics    Past Medical History:  Diagnosis Date  . Arthritis    trigger fingers  . Baker's cyst   . Chronic cough   . Environmental allergies   . GERD (gastroesophageal reflux disease)   . Hypercholesterolemia   . Osteoporosis      Past Surgical History:  Procedure Laterality Date  . APPENDECTOMY    . OOPHORECTOMY      Family History  Problem Relation Age of Onset  . Uterine cancer Mother   . Diabetes Mother   . Prostate cancer Father   . Diabetes Brother   . Diabetes Sister   . Stroke Paternal Grandmother   . Heart disease Maternal Grandmother   .  Prostate cancer Paternal Grandfather   . Colon cancer Neg Hx     Social History   Tobacco Use  . Smoking status: Former Games developer  . Smokeless tobacco: Never Used  . Tobacco comment: quit 30 yrs ago  Substance Use Topics  . Alcohol use: No    Alcohol/week: 0.0 standard drinks  . Drug use: No    ROS   Objective:   Vitals: BP (!) 156/71 (BP Location: Right Arm)   Pulse 80   Resp 16   Wt 143 lb (64.9 kg)   SpO2 98%   BMI 23.08 kg/m   Physical Exam Constitutional:      General: She is not in acute distress.    Appearance: Normal appearance. She is well-developed. She is not ill-appearing, toxic-appearing or diaphoretic.  HENT:     Head: Normocephalic and atraumatic.     Nose: Nose normal.     Mouth/Throat:     Mouth: Mucous  membranes are moist.     Pharynx: Oropharynx is clear.  Eyes:     General: No scleral icterus.    Extraocular Movements: Extraocular movements intact.     Pupils: Pupils are equal, round, and reactive to light.  Cardiovascular:     Rate and Rhythm: Normal rate.  Pulmonary:     Effort: Pulmonary effort is normal.  Musculoskeletal:     Right knee: Swelling (trace directly over patella, patellar tendon) and effusion (as seen on radiographs) present. No deformity, erythema, ecchymosis, lacerations, bony tenderness or crepitus. Normal range of motion. No tenderness. Normal alignment and normal patellar mobility.       Legs:  Skin:    General: Skin is warm and dry.  Neurological:     General: No focal deficit present.     Mental Status: She is alert and oriented to person, place, and time.  Psychiatric:        Mood and Affect: Mood normal.        Behavior: Behavior normal.        Thought Content: Thought content normal.        Judgment: Judgment normal.     DG Knee Complete 4 Views Right  Result Date: 09/03/2019 CLINICAL DATA:  Right knee pain. EXAM: RIGHT KNEE - COMPLETE 4+ VIEW COMPARISON:  None. FINDINGS: There are tricompartmental degenerative changes. Chondrocalcinosis is noted. There is a small suprapatellar joint effusion. There is no acute displaced fracture or dislocation. Vascular calcifications are noted. IMPRESSION: 1. No acute displaced fracture or dislocation. 2. Tricompartmental degenerative changes. 3. Small suprapatellar joint effusion. Electronically Signed   By: Katherine Mantle M.D.   On: 09/03/2019 17:38    Assessment and Plan :   1. Acute pain of right knee   2. Effusion of right knee   3. Osteoarthritis of right knee, unspecified osteoarthritis type     We will have patient undergo a prednisone course given her tricompartmental degenerative arthritis, acute knee pain and small joint effusion.  Counseled patient that she needs to follow-up with her orthopedist  at [facility].  She does have a history of Baker's cyst and counseled that this potentially could be a source of her knee pain or another possibility would be degenerative meniscus that is in need of further evaluation including MRI or ultrasound.  Patient is in agreement with treatment plan, will follow up with Delbert Harness, ASAP. Counseled patient on potential for adverse effects with medications prescribed/recommended today, ER and return-to-clinic precautions discussed, patient verbalized understanding.  Jaynee Eagles, PA-C 09/03/19 1755

## 2019-09-03 NOTE — ED Triage Notes (Signed)
Pt reports she was outside with her dog today and felt a sharp pain in her right knee. Ever since then, she states it has been an aching pain.

## 2019-09-21 ENCOUNTER — Ambulatory Visit
Admission: RE | Admit: 2019-09-21 | Discharge: 2019-09-21 | Disposition: A | Discharge status: Home / Self Care | Payer: Medicare Other | Source: Ambulatory Visit | Attending: Family Medicine | Admitting: Family Medicine

## 2019-09-21 ENCOUNTER — Other Ambulatory Visit: Payer: Self-pay

## 2019-09-21 DIAGNOSIS — M810 Age-related osteoporosis without current pathological fracture: Secondary | ICD-10-CM

## 2020-06-20 ENCOUNTER — Other Ambulatory Visit: Payer: Self-pay | Admitting: Family Medicine

## 2020-06-20 DIAGNOSIS — Z1231 Encounter for screening mammogram for malignant neoplasm of breast: Secondary | ICD-10-CM

## 2020-08-07 ENCOUNTER — Ambulatory Visit: Payer: Medicare PPO

## 2020-09-25 ENCOUNTER — Other Ambulatory Visit: Payer: Self-pay

## 2020-09-25 ENCOUNTER — Ambulatory Visit
Admission: RE | Admit: 2020-09-25 | Discharge: 2020-09-25 | Disposition: A | Discharge status: Home / Self Care | Payer: Medicare PPO | Source: Ambulatory Visit | Attending: Family Medicine | Admitting: Family Medicine

## 2020-09-25 DIAGNOSIS — Z1231 Encounter for screening mammogram for malignant neoplasm of breast: Secondary | ICD-10-CM

## 2021-08-17 ENCOUNTER — Other Ambulatory Visit: Payer: Self-pay | Admitting: Family Medicine

## 2021-08-17 DIAGNOSIS — Z1231 Encounter for screening mammogram for malignant neoplasm of breast: Secondary | ICD-10-CM

## 2021-09-27 ENCOUNTER — Ambulatory Visit
Admission: RE | Admit: 2021-09-27 | Discharge: 2021-09-27 | Disposition: A | Discharge status: Home / Self Care | Payer: Medicare PPO | Source: Ambulatory Visit | Attending: Family Medicine | Admitting: Family Medicine

## 2021-09-27 DIAGNOSIS — Z1231 Encounter for screening mammogram for malignant neoplasm of breast: Secondary | ICD-10-CM

## 2022-08-15 ENCOUNTER — Other Ambulatory Visit: Payer: Self-pay | Admitting: Family Medicine

## 2022-08-15 DIAGNOSIS — Z1231 Encounter for screening mammogram for malignant neoplasm of breast: Secondary | ICD-10-CM

## 2022-10-07 ENCOUNTER — Ambulatory Visit
Admission: RE | Admit: 2022-10-07 | Discharge: 2022-10-07 | Disposition: A | Discharge status: Home / Self Care | Payer: Medicare PPO | Source: Ambulatory Visit | Attending: Family Medicine | Admitting: Family Medicine

## 2022-10-07 DIAGNOSIS — Z1231 Encounter for screening mammogram for malignant neoplasm of breast: Secondary | ICD-10-CM

## 2023-08-27 ENCOUNTER — Other Ambulatory Visit: Payer: Self-pay | Admitting: Family Medicine

## 2023-08-27 DIAGNOSIS — Z1231 Encounter for screening mammogram for malignant neoplasm of breast: Secondary | ICD-10-CM

## 2023-10-15 ENCOUNTER — Ambulatory Visit
Admission: RE | Admit: 2023-10-15 | Discharge: 2023-10-15 | Disposition: A | Discharge status: Home / Self Care | Source: Ambulatory Visit | Attending: Family Medicine | Admitting: Family Medicine

## 2023-10-15 DIAGNOSIS — Z1231 Encounter for screening mammogram for malignant neoplasm of breast: Secondary | ICD-10-CM
